# Patient Record
Sex: Female | Born: 1980 | Race: White | Hispanic: No | Marital: Married | Smoking: Never smoker
Health system: Southern US, Community
[De-identification: ages and names within clinical notes are randomized; demographics above are authoritative.]

## PROBLEM LIST (undated history)

## (undated) ENCOUNTER — Inpatient Hospital Stay (HOSPITAL_COMMUNITY): Payer: Self-pay

## (undated) DIAGNOSIS — G43909 Migraine, unspecified, not intractable, without status migrainosus: Secondary | ICD-10-CM

## (undated) DIAGNOSIS — J302 Other seasonal allergic rhinitis: Secondary | ICD-10-CM

## (undated) HISTORY — PX: WISDOM TOOTH EXTRACTION: SHX21

---

## 2008-03-22 ENCOUNTER — Ambulatory Visit: Payer: Self-pay | Admitting: Genetic Counselor

## 2009-06-25 ENCOUNTER — Encounter: Admission: RE | Admit: 2009-06-25 | Discharge: 2009-06-25 | Payer: Self-pay | Admitting: Obstetrics and Gynecology

## 2011-08-24 ENCOUNTER — Emergency Department (HOSPITAL_BASED_OUTPATIENT_CLINIC_OR_DEPARTMENT_OTHER)
Admission: EM | Admit: 2011-08-24 | Discharge: 2011-08-24 | Disposition: A | Discharge status: Home / Self Care | Payer: 59 | Attending: Emergency Medicine | Admitting: Emergency Medicine

## 2011-08-24 ENCOUNTER — Encounter (HOSPITAL_BASED_OUTPATIENT_CLINIC_OR_DEPARTMENT_OTHER): Payer: Self-pay | Admitting: *Deleted

## 2011-08-24 DIAGNOSIS — G43909 Migraine, unspecified, not intractable, without status migrainosus: Secondary | ICD-10-CM | POA: Insufficient documentation

## 2011-08-24 HISTORY — DX: Other seasonal allergic rhinitis: J30.2

## 2011-08-24 HISTORY — DX: Migraine, unspecified, not intractable, without status migrainosus: G43.909

## 2011-08-24 MED ORDER — KETOROLAC TROMETHAMINE 30 MG/ML IJ SOLN
30.0000 mg | Freq: Once | INTRAMUSCULAR | Status: AC
  Administered 2011-08-24: 30 mg via INTRAVENOUS
  Filled 2011-08-24: qty 1

## 2011-08-24 MED ORDER — DEXAMETHASONE SODIUM PHOSPHATE 10 MG/ML IJ SOLN
10.0000 mg | Freq: Once | INTRAMUSCULAR | Status: AC
  Administered 2011-08-24: 10 mg via INTRAVENOUS
  Filled 2011-08-24: qty 1

## 2011-08-24 MED ORDER — METOCLOPRAMIDE HCL 5 MG/ML IJ SOLN
10.0000 mg | Freq: Once | INTRAMUSCULAR | Status: AC
  Administered 2011-08-24: 10 mg via INTRAVENOUS
  Filled 2011-08-24: qty 2

## 2011-08-24 MED ORDER — DIPHENHYDRAMINE HCL 50 MG/ML IJ SOLN
12.5000 mg | Freq: Once | INTRAMUSCULAR | Status: AC
  Administered 2011-08-24: 12.5 mg via INTRAVENOUS
  Filled 2011-08-24: qty 1

## 2011-08-24 MED ORDER — SODIUM CHLORIDE 0.9 % IV BOLUS (SEPSIS)
1000.0000 mL | Freq: Once | INTRAVENOUS | Status: AC
  Administered 2011-08-24: 1000 mL via INTRAVENOUS

## 2011-08-24 NOTE — ED Provider Notes (Signed)
History  This chart was scribed for Kim Numbers, MD by Erskine Emery. This patient was seen in room MH10/MH10 and the patient's care was started at 18:18.  CSN: [id number]  Arrival date & time 08/24/11  1804   First MD Initiated Contact with Patient 08/24/11 1818      Chief Complaint  Patient presents with  . Migraine    (Consider location/radiation/quality/duration/timing/severity/associated sxs/prior treatment) HPI  Kim Collins is a 31 y.o. female who presents to the Emergency Department complaining of a constant, worsening severe headache located in the right forehead with no radiation since last weekend with associated general weakness, chills, photophobia, nausea, vomiting, and sensitivity to sound. Pt has only vomited once today, after coming to the ED. Pt denies any associated dizziness, neck pain, or tingling and numbness of extremities. Pt reports a history of migraines since she was little and describes her symptoms as similar to previous episodes, but claims she has only ever had about 2 or 3 episodes of similar severity. Pt reports that normally her head will get hot when an episode is coming on and that this did happen with this episode, but that she also noticed her head was less hot than normal in the car on the way to the ED. Pt reports that seasonal changes and cessation of her birth control medication typically bring on similar episodes and that she recently stopped taking her birth control because it was time for a placebo week. Pt has a neurologist (Dr. Vela Prose) that follows her for her migraines and has prescribed her a three-tier medication regimen, including ketoprofen, Imitrex, and Dilaudid. Pt reports she has been taking those medications with temporary mild relief, then repeated worsening. Pt also reports she took Dilaudid this morning with no relief. Pt denies fever but states that her head is warm. Pt also denies any urinary symptoms and states that she just had her period  so she believes she is not pregnant, nor is she trying to get pregnant. Pt reports no known allergies and claims she has never before come to the ED to treat a headache. Pt reports she has an appointment with Dr. Vela Prose tomorrow but the pain was so severe she couldn't wait, and typically she sees Dr. Vela Prose every 4-6 months for a routine check up.    Past Medical History  Diagnosis Date  . Migraines   . Seasonal allergies     History reviewed. No pertinent past surgical history.  History reviewed. No pertinent family history.  History  Substance Use Topics  . Smoking status: Never Smoker   . Smokeless tobacco: Not on file  . Alcohol Use: Yes    OB History    Grav Para Term Preterm Abortions TAB SAB Ect Mult Living                  Review of Systems  Constitutional: Positive for chills.  HENT: Negative for neck pain.   Eyes: Positive for photophobia.  Respiratory: Negative.   Cardiovascular: Negative.   Gastrointestinal: Positive for nausea and vomiting.  Genitourinary: Negative.   Musculoskeletal: Negative.   Skin: Negative.   Neurological: Positive for weakness and headaches. Negative for numbness.  Hematological: Negative.   Psychiatric/Behavioral: Negative.   All other systems reviewed and are negative.    A complete 10 system review of systems was obtained and all systems are negative except as noted in the HPI and PMH.   Allergies  Review of patient's allergies indicates not on file.  Home Medications  No current outpatient prescriptions on file.  Triage Vitals: BP 118/74  Pulse 88  Temp 97.4 F (36.3 C) (Oral)  Resp 18  Ht 5\' 2"  (1.575 m)  Wt 125 lb (56.7 kg)  BMI 22.86 kg/m2  SpO2 100%  LMP 08/21/2011  Physical Exam  Nursing note and vitals reviewed. GEN: Well-developed, well-nourished female in no distress HEENT: Atraumatic, normocephalic. Oropharynx clear without erythema EYES: PERRLA BL, no scleral icterus. NECK: Trachea midline, no  meningismus CV: regular rate and rhythm. No murmurs, rubs, or gallops PULM: No respiratory distress.  No crackles, wheezes, or rales. GI: soft, non-tender. No guarding, rebound, or tenderness. + bowel sounds  GU: deferred Neuro: cranial nerves grossly 2-12 intact, no abnormalities of strength or sensation, A and O x 3, no pronator drift, no dysmetria on finger to nose test bilaterally MSK: Patient moves all 4 extremities symmetrically, no deformity, edema, or injury noted Skin: No rashes petechiae, purpura, or jaundice Psych: no abnormality of mood   ED Course  Procedures (including critical care time)  DIAGNOSTIC STUDIES: Oxygen Saturation is 100% on room air, normal by my interpretation.    COORDINATION OF CARE:  18:30--I discussed treatment plan including medication with pt and pt agreed. 1830: Medication Orders: Diphenhydramine (Benadryl) injection 12.5 mg-once; Metoclopramide (Reglan) injection 10 mg-once; Sodium Chloride 0.9% bolus 1,000 mL-once 1915: Medication Orders: Ketorolac (Toradol) 30 mg/mL injection 30 mg-once.   Labs Reviewed - No data to display No results found.   1. Migraine       MDM  Patient was evaluated by myself. Based on presentation patient was having a more severe version of her normal migraine. She was given headache cocktail with Benadryl, Reglan, and IV fluids. Patient had improvement of her pain to a 4/10. A dose of Toradol was ordered. Patient confirms that she was no longer nauseous. She had no focal neurologic symptoms or head injury to suggest need for intracranial injury today. Patient was afebrile and hemodynamically stable. She'll be reassessed following her dose of Toradol.  8:13 PM Following this patient reports that her pain is fairly completely gone. Patient was given a dose of Decadron IV. She can followup with her neurologist tomorrow.  I personally performed the services described in this documentation, which was scribed in my  presence. The recorded information has been reviewed and considered.         Kim Numbers, MD 08/24/11 2017

## 2011-08-24 NOTE — ED Notes (Signed)
Pt ambulated without difficulty IV DCed

## 2011-08-24 NOTE — ED Notes (Signed)
Pt has hx of migraines and has had this one off and on x 1 week. Has tried all 3 "tiers" of her medications without relief.

## 2015-02-18 NOTE — L&D Delivery Note (Signed)
Delivery Note At 4:39 PM a viable female was delivered via Vaginal, Spontaneous Delivery (presentation:vertex).  APGAR: 8, 9; weight 6 lb 11.6 oz (3050 g).   Placenta status: routine  Cord:  with the following complications: loose nuchal cord, delivered through it.  Anesthesia:   Episiotomy: Median Lacerations: Vaginal;1st degree Suture Repair: 3.0 vicryl rapide Est. Blood Loss (mL): 450cc  Mom to postpartum.  Baby to Couplet care / Skin to Skin.  Small ~2cm benign labial nevus on L labia removed. Consent signed prior to planned removal.   Kim Collins 10/19/2015, 6:51 PM

## 2015-03-12 LAB — OB RESULTS CONSOLE ANTIBODY SCREEN: ANTIBODY SCREEN: NEGATIVE

## 2015-03-12 LAB — OB RESULTS CONSOLE ABO/RH: RH TYPE: POSITIVE

## 2015-03-12 LAB — OB RESULTS CONSOLE RPR: RPR: NONREACTIVE

## 2015-03-12 LAB — OB RESULTS CONSOLE HGB/HCT, BLOOD
HCT: 42 %
HEMOGLOBIN: 13.8 g/dL

## 2015-03-12 LAB — OB RESULTS CONSOLE RUBELLA ANTIBODY, IGM: Rubella: IMMUNE

## 2015-03-12 LAB — OB RESULTS CONSOLE HEPATITIS B SURFACE ANTIGEN: HEP B S AG: NEGATIVE

## 2015-03-12 LAB — OB RESULTS CONSOLE HIV ANTIBODY (ROUTINE TESTING): HIV: NONREACTIVE

## 2015-03-23 LAB — OB RESULTS CONSOLE GC/CHLAMYDIA
CHLAMYDIA, DNA PROBE: NEGATIVE
Gonorrhea: NEGATIVE

## 2015-09-12 LAB — OB RESULTS CONSOLE GBS: GBS: NEGATIVE

## 2015-10-08 ENCOUNTER — Inpatient Hospital Stay (HOSPITAL_COMMUNITY)
Admission: AD | Admit: 2015-10-08 | Discharge: 2015-10-08 | Disposition: A | Discharge status: Home / Self Care | Payer: 59 | Source: Ambulatory Visit | Attending: Obstetrics and Gynecology | Admitting: Obstetrics and Gynecology

## 2015-10-08 ENCOUNTER — Encounter (HOSPITAL_COMMUNITY): Payer: Self-pay | Admitting: *Deleted

## 2015-10-08 DIAGNOSIS — Z3A40 40 weeks gestation of pregnancy: Secondary | ICD-10-CM | POA: Diagnosis not present

## 2015-10-08 DIAGNOSIS — O471 False labor at or after 37 completed weeks of gestation: Secondary | ICD-10-CM | POA: Diagnosis not present

## 2015-10-08 NOTE — MAU Note (Signed)
Contractions for awhile this AM. No leaking or bleeding.

## 2015-10-08 NOTE — Discharge Instructions (Signed)
Keep your scheduled appt for prenatal care. Call the office or nurse on call with further concerns or return to MAU as needed.

## 2015-10-12 ENCOUNTER — Encounter (HOSPITAL_COMMUNITY): Payer: Self-pay

## 2015-10-12 ENCOUNTER — Telehealth (HOSPITAL_COMMUNITY): Payer: Self-pay | Admitting: *Deleted

## 2015-10-12 ENCOUNTER — Inpatient Hospital Stay (HOSPITAL_COMMUNITY)
Admission: AD | Admit: 2015-10-12 | Discharge: 2015-10-12 | Disposition: A | Discharge status: Home / Self Care | Payer: 59 | Source: Ambulatory Visit | Attending: Obstetrics and Gynecology | Admitting: Obstetrics and Gynecology

## 2015-10-12 DIAGNOSIS — Z3A39 39 weeks gestation of pregnancy: Secondary | ICD-10-CM | POA: Insufficient documentation

## 2015-10-12 DIAGNOSIS — Z3493 Encounter for supervision of normal pregnancy, unspecified, third trimester: Secondary | ICD-10-CM | POA: Insufficient documentation

## 2015-10-12 LAB — POCT FERN TEST: POCT Fern Test: NEGATIVE

## 2015-10-12 NOTE — MAU Note (Signed)
?   Leaking small amounts since yesterday- once in morning and one in evening.  Then ? Leaking small amt this afternoon.  Clear and watery each time. Contractions have been 4-7 min in the past but since 2 hours ago, they seem to be lasting longer.  No bleeding. Baby is moving less today.

## 2015-10-12 NOTE — MAU Provider Note (Signed)
Ms. Kim Collins is a 35 y.o. G1P0 at [redacted]w[redacted]d  who presents to MAU today complaining of LOF since yesterday. She states intermittent LOF noted a few times since yesterday. She denies vaginal bleeding. She states regular contractions prior to arrival, but less now. She reports some decreased fetal movement for the last few days, but has noted movement since arrival in MAU. She denies complications with the pregnancy.   BP 140/83   Pulse 78   Temp 98.4 F (36.9 C) (Oral)   Resp 16  GENERAL: Well-developed, well-nourished female in no acute distress.  HEAD: Normocephalic, atraumatic.  CHEST: Normal effort of breathing, regular heart rate ABDOMEN: Soft, nontender, nondistended.  PELVIC: Normal external female genitalia. Vagina is pink and rugated.   Moderate amount of thin, white discharge.  Negative pooling. Gravid uterus.   EXTREMITIES: No cyanosis, clubbing, or edema  Fern - negative  Fetal Monitoring:  Baseline: 130 bpm, moderate variability, + accelerations, no decelerations Contractions: moderate UI with occasional contractions  A: SIUP at [redacted]w[redacted]d Membranes intact  P: Report given to RN to contact MD on call for further instructions  Luvenia Redden, PA-C 10/12/2015 8:11 PM

## 2015-10-12 NOTE — Telephone Encounter (Signed)
Preadmission screen  

## 2015-10-12 NOTE — Discharge Instructions (Signed)
Third Trimester of Pregnancy °The third trimester is from week 29 through week 42, months 7 through 9. The third trimester is a time when the fetus is growing rapidly. At the end of the ninth month, the fetus is about 20 inches in length and weighs 6-10 pounds.  °BODY CHANGES °Your body goes through many changes during pregnancy. The changes vary from woman to woman.  °· Your weight will continue to increase. You can expect to gain 25-35 pounds (11-16 kg) by the end of the pregnancy. °· You may begin to get stretch marks on your hips, abdomen, and breasts. °· You may urinate more often because the fetus is moving lower into your pelvis and pressing on your bladder. °· You may develop or continue to have heartburn as a result of your pregnancy. °· You may develop constipation because certain hormones are causing the muscles that push waste through your intestines to slow down. °· You may develop hemorrhoids or swollen, bulging veins (varicose veins). °· You may have pelvic pain because of the weight gain and pregnancy hormones relaxing your joints between the bones in your pelvis. Backaches may result from overexertion of the muscles supporting your posture. °· You may have changes in your hair. These can include thickening of your hair, rapid growth, and changes in texture. Some women also have hair loss during or after pregnancy, or hair that feels dry or thin. Your hair will most likely return to normal after your baby is born. °· Your breasts will continue to grow and be tender. A yellow discharge may leak from your breasts called colostrum. °· Your belly button may stick out. °· You may feel short of breath because of your expanding uterus. °· You may notice the fetus "dropping," or moving lower in your abdomen. °· You may have a bloody mucus discharge. This usually occurs a few days to a week before labor begins. °· Your cervix becomes thin and soft (effaced) near your due date. °WHAT TO EXPECT AT YOUR PRENATAL  EXAMS  °You will have prenatal exams every 2 weeks until week 36. Then, you will have weekly prenatal exams. During a routine prenatal visit: °· You will be weighed to make sure you and the fetus are growing normally. °· Your blood pressure is taken. °· Your abdomen will be measured to track your baby's growth. °· The fetal heartbeat will be listened to. °· Any test results from the previous visit will be discussed. °· You may have a cervical check near your due date to see if you have effaced. °At around 36 weeks, your caregiver will check your cervix. At the same time, your caregiver will also perform a test on the secretions of the vaginal tissue. This test is to determine if a type of bacteria, Group B streptococcus, is present. Your caregiver will explain this further. °Your caregiver may ask you: °· What your birth plan is. °· How you are feeling. °· If you are feeling the baby move. °· If you have had any abnormal symptoms, such as leaking fluid, bleeding, severe headaches, or abdominal cramping. °· If you are using any tobacco products, including cigarettes, chewing tobacco, and electronic cigarettes. °· If you have any questions. °Other tests or screenings that may be performed during your third trimester include: °· Blood tests that check for low iron levels (anemia). °· Fetal testing to check the health, activity level, and growth of the fetus. Testing is done if you have certain medical conditions or if   there are problems during the pregnancy. °· HIV (human immunodeficiency virus) testing. If you are at high risk, you may be screened for HIV during your third trimester of pregnancy. °FALSE LABOR °You may feel small, irregular contractions that eventually go away. These are called Braxton Hicks contractions, or false labor. Contractions may last for hours, days, or even weeks before true labor sets in. If contractions come at regular intervals, intensify, or become painful, it is best to be seen by your  caregiver.  °SIGNS OF LABOR  °· Menstrual-like cramps. °· Contractions that are 5 minutes apart or less. °· Contractions that start on the top of the uterus and spread down to the lower abdomen and back. °· A sense of increased pelvic pressure or back pain. °· A watery or bloody mucus discharge that comes from the vagina. °If you have any of these signs before the 37th week of pregnancy, call your caregiver right away. You need to go to the hospital to get checked immediately. °HOME CARE INSTRUCTIONS  °· Avoid all smoking, herbs, alcohol, and unprescribed drugs. These chemicals affect the formation and growth of the baby. °· Do not use any tobacco products, including cigarettes, chewing tobacco, and electronic cigarettes. If you need help quitting, ask your health care provider. You may receive counseling support and other resources to help you quit. °· Follow your caregiver's instructions regarding medicine use. There are medicines that are either safe or unsafe to take during pregnancy. °· Exercise only as directed by your caregiver. Experiencing uterine cramps is a good sign to stop exercising. °· Continue to eat regular, healthy meals. °· Wear a good support bra for breast tenderness. °· Do not use hot tubs, steam rooms, or saunas. °· Wear your seat belt at all times when driving. °· Avoid raw meat, uncooked cheese, cat litter boxes, and soil used by cats. These carry germs that can cause birth defects in the baby. °· Take your prenatal vitamins. °· Take 1500-2000 mg of calcium daily starting at the 20th week of pregnancy until you deliver your baby. °· Try taking a stool softener (if your caregiver approves) if you develop constipation. Eat more high-fiber foods, such as fresh vegetables or fruit and whole grains. Drink plenty of fluids to keep your urine clear or pale yellow. °· Take warm sitz baths to soothe any pain or discomfort caused by hemorrhoids. Use hemorrhoid cream if your caregiver approves. °· If  you develop varicose veins, wear support hose. Elevate your feet for 15 minutes, 3-4 times a day. Limit salt in your diet. °· Avoid heavy lifting, wear low heal shoes, and practice good posture. °· Rest a lot with your legs elevated if you have leg cramps or low back pain. °· Visit your dentist if you have not gone during your pregnancy. Use a soft toothbrush to brush your teeth and be gentle when you floss. °· A sexual relationship may be continued unless your caregiver directs you otherwise. °· Do not travel far distances unless it is absolutely necessary and only with the approval of your caregiver. °· Take prenatal classes to understand, practice, and ask questions about the labor and delivery. °· Make a trial run to the hospital. °· Pack your hospital bag. °· Prepare the baby's nursery. °· Continue to go to all your prenatal visits as directed by your caregiver. °SEEK MEDICAL CARE IF: °· You are unsure if you are in labor or if your water has broken. °· You have dizziness. °· You have   mild pelvic cramps, pelvic pressure, or nagging pain in your abdominal area.  You have persistent nausea, vomiting, or diarrhea.  You have a bad smelling vaginal discharge.  You have pain with urination. SEEK IMMEDIATE MEDICAL CARE IF:   You have a fever.  You are leaking fluid from your vagina.  You have spotting or bleeding from your vagina.  You have severe abdominal cramping or pain.  You have rapid weight loss or gain.  You have shortness of breath with chest pain.  You notice sudden or extreme swelling of your face, hands, ankles, feet, or legs.  You have not felt your baby move in over an hour.  You have severe headaches that do not go away with medicine.  You have vision changes.   This information is not intended to replace advice given to you by your health care provider. Make sure you discuss any questions you have with your health care provider.   Document Released: 01/28/2001 Document  Revised: 02/24/2014 Document Reviewed: 04/06/2012 Elsevier Interactive Patient Education 2016 [employer] of the uterus can occur throughout pregnancy. Contractions are not always a sign that you are in labor.  WHAT ARE BRAXTON HICKS CONTRACTIONS?  Contractions that occur before labor are called Braxton Hicks contractions, or false labor. Toward the end of pregnancy (32-34 weeks), these contractions can develop more often and may become more forceful. This is not true labor because these contractions do not result in opening (dilatation) and thinning of the cervix. They are sometimes difficult to tell apart from true labor because these contractions can be forceful and people have different pain tolerances. You should not feel embarrassed if you go to the hospital with false labor. Sometimes, the only way to tell if you are in true labor is for your health care provider to look for changes in the cervix. If there are no prenatal problems or other health problems associated with the pregnancy, it is completely safe to be sent home with false labor and await the onset of true labor. HOW CAN YOU TELL THE DIFFERENCE BETWEEN TRUE AND FALSE LABOR? False Labor  The contractions of false labor are usually shorter and not as hard as those of true labor.   The contractions are usually irregular.   The contractions are often felt in the front of the lower abdomen and in the groin.   The contractions may go away when you walk around or change positions while lying down.   The contractions get weaker and are shorter lasting as time goes on.   The contractions do not usually become progressively stronger, regular, and closer together as with true labor.  True Labor  Contractions in true labor last 30-70 seconds, become very regular, usually become more intense, and increase in frequency.   The contractions do not go away with walking.   The discomfort  is usually felt in the top of the uterus and spreads to the lower abdomen and low back.   True labor can be determined by your health care provider with an exam. This will show that the cervix is dilating and getting thinner.  WHAT TO REMEMBER  Keep up with your usual exercises and follow other instructions given by your health care provider.   Take medicines as directed by your health care provider.   Keep your regular prenatal appointments.   Eat and drink lightly if you think you are going into labor.   If Braxton Hicks contractions are making  you uncomfortable:   Change your position from lying down or resting to walking, or from walking to resting.   Sit and rest in a tub of warm water.   Drink 2-3 glasses of water. Dehydration may cause these contractions.   Do slow and deep breathing several times an hour.  WHEN SHOULD I SEEK IMMEDIATE MEDICAL CARE? Seek immediate medical care if:  Your contractions become stronger, more regular, and closer together.   You have fluid leaking or gushing from your vagina.   You have a fever.   You pass blood-tinged mucus.   You have vaginal bleeding.   You have continuous abdominal pain.   You have low back pain that you never had before.   You feel your baby's head pushing down and causing pelvic pressure.   Your baby is not moving as much as it used to.    This information is not intended to replace advice given to you by your health care provider. Make sure you discuss any questions you have with your health care provider.   Document Released: 02/03/2005 Document Revised: 02/08/2013 Document Reviewed: 11/15/2012 Elsevier Interactive Patient Education 2016 [place]. Fetal Movement Counts Patient Name: __________________________________________________ Patient Due Date: ____________________ Performing a fetal movement count is highly recommended in high-risk pregnancies, but it is good for every pregnant  woman to do. Your health care provider may ask you to start counting fetal movements at 28 weeks of the pregnancy. Fetal movements often increase:  After eating a full meal.  After physical activity.  After eating or drinking something sweet or cold.  At rest. Pay attention to when you feel the baby is most active. This will help you notice a pattern of your baby's sleep and wake cycles and what factors contribute to an increase in fetal movement. It is important to perform a fetal movement count at the same time each day when your baby is normally most active.  HOW TO COUNT FETAL MOVEMENTS 1. Find a quiet and comfortable area to sit or lie down on your left side. Lying on your left side provides the best blood and oxygen circulation to your baby. 2. Write down the day and time on a sheet of paper or in a journal. 3. Start counting kicks, flutters, swishes, rolls, or jabs in a 2-hour period. You should feel at least 10 movements within 2 hours. 4. If you do not feel 10 movements in 2 hours, wait 2-3 hours and count again. Look for a change in the pattern or not enough counts in 2 hours. SEEK MEDICAL CARE IF:  You feel less than 10 counts in 2 hours, tried twice.  There is no movement in over an hour.  The pattern is changing or taking longer each day to reach 10 counts in 2 hours.  You feel the baby is not moving as he or she usually does. Date: ____________ Movements: ____________ Start time: ____________ Kim Collins time: ____________  Date: ____________ Movements: ____________ Start time: ____________ Kim Collins time: ____________ Date: ____________ Movements: ____________ Start time: ____________ Kim Collins time: ____________ Date: ____________ Movements: ____________ Start time: ____________ Kim Collins time: ____________ Date: ____________ Movements: ____________ Start time: ____________ Kim Collins time: ____________ Date: ____________ Movements: ____________ Start time: ____________ Kim Collins time:  ____________ Date: ____________ Movements: ____________ Start time: ____________ Kim Collins time: ____________ Date: ____________ Movements: ____________ Start time: ____________ Kim Collins time: ____________  Date: ____________ Movements: ____________ Start time: ____________ Kim Collins time: ____________ Date: ____________ Movements: ____________ Start time: ____________ Kim Collins time: ____________ Date:  ____________ Movements: ____________ Start time: ____________ Kim Collins time: ____________ Date: ____________ Movements: ____________ Start time: ____________ Kim Collins time: ____________ Date: ____________ Movements: ____________ Start time: ____________ Kim Collins time: ____________ Date: ____________ Movements: ____________ Start time: ____________ Kim Collins time: ____________ Date: ____________ Movements: ____________ Start time: ____________ Kim Collins time: ____________  Date: ____________ Movements: ____________ Start time: ____________ Kim Collins time: ____________ Date: ____________ Movements: ____________ Start time: ____________ Kim Collins time: ____________ Date: ____________ Movements: ____________ Start time: ____________ Kim Collins time: ____________ Date: ____________ Movements: ____________ Start time: ____________ Kim Collins time: ____________ Date: ____________ Movements: ____________ Start time: ____________ Kim Collins time: ____________ Date: ____________ Movements: ____________ Start time: ____________ Kim Collins time: ____________ Date: ____________ Movements: ____________ Start time: ____________ Kim Collins time: ____________  Date: ____________ Movements: ____________ Start time: ____________ Kim Collins time: ____________ Date: ____________ Movements: ____________ Start time: ____________ Kim Collins time: ____________ Date: ____________ Movements: ____________ Start time: ____________ Kim Collins time: ____________ Date: ____________ Movements: ____________ Start time: ____________ Kim Collins time: ____________ Date: ____________ Movements:  ____________ Start time: ____________ Kim Collins time: ____________ Date: ____________ Movements: ____________ Start time: ____________ Kim Collins time: ____________ Date: ____________ Movements: ____________ Start time: ____________ Kim Collins time: ____________  Date: ____________ Movements: ____________ Start time: ____________ Kim Collins time: ____________ Date: ____________ Movements: ____________ Start time: ____________ Kim Collins time: ____________ Date: ____________ Movements: ____________ Start time: ____________ Kim Collins time: ____________ Date: ____________ Movements: ____________ Start time: ____________ Kim Collins time: ____________ Date: ____________ Movements: ____________ Start time: ____________ Kim Collins time: ____________ Date: ____________ Movements: ____________ Start time: ____________ Kim Collins time: ____________ Date: ____________ Movements: ____________ Start time: ____________ Kim Collins time: ____________  Date: ____________ Movements: ____________ Start time: ____________ Kim Collins time: ____________ Date: ____________ Movements: ____________ Start time: ____________ Kim Collins time: ____________ Date: ____________ Movements: ____________ Start time: ____________ Kim Collins time: ____________ Date: ____________ Movements: ____________ Start time: ____________ Kim Collins time: ____________ Date: ____________ Movements: ____________ Start time: ____________ Kim Collins time: ____________ Date: ____________ Movements: ____________ Start time: ____________ Kim Collins time: ____________ Date: ____________ Movements: ____________ Start time: ____________ Kim Collins time: ____________  Date: ____________ Movements: ____________ Start time: ____________ Kim Collins time: ____________ Date: ____________ Movements: ____________ Start time: ____________ Kim Collins time: ____________ Date: ____________ Movements: ____________ Start time: ____________ Kim Collins time: ____________ Date: ____________ Movements: ____________ Start time: ____________ Kim Collins  time: ____________ Date: ____________ Movements: ____________ Start time: ____________ Kim Collins time: ____________ Date: ____________ Movements: ____________ Start time: ____________ Kim Collins time: ____________ Date: ____________ Movements: ____________ Start time: ____________ Kim Collins time: ____________  Date: ____________ Movements: ____________ Start time: ____________ Kim Collins time: ____________ Date: ____________ Movements: ____________ Start time: ____________ Kim Collins time: ____________ Date: ____________ Movements: ____________ Start time: ____________ Kim Collins time: ____________ Date: ____________ Movements: ____________ Start time: ____________ Kim Collins time: ____________ Date: ____________ Movements: ____________ Start time: ____________ Kim Collins time: ____________ Date: ____________ Movements: ____________ Start time: ____________ Kim Collins time: ____________   This information is not intended to replace advice given to you by your health care provider. Make sure you discuss any questions you have with your health care provider.   Document Released: 03/05/2006 Document Revised: 02/24/2014 Document Reviewed: 12/01/2011 Elsevier Interactive Patient Education [employer].

## 2015-10-18 ENCOUNTER — Encounter (HOSPITAL_COMMUNITY): Payer: Self-pay | Admitting: *Deleted

## 2015-10-18 ENCOUNTER — Telehealth (HOSPITAL_COMMUNITY): Payer: Self-pay | Admitting: *Deleted

## 2015-10-18 NOTE — H&P (Signed)
Kim Collins is a 35 y.o. female presenting for IOL.  OB History    Gravida Para Term Preterm AB Living   1             SAB TAB Ectopic Multiple Live Births                 Past Medical History:  Diagnosis Date  . Migraines   . Seasonal allergies    Past Surgical History:  Procedure Laterality Date  . WISDOM TOOTH EXTRACTION     Family History: family history includes Cancer in her father, paternal aunt, and paternal grandmother; Diabetes in her father; Hypertension in her father. Social History:  reports that she has never smoked. She has never used smokeless tobacco. She reports that she drinks alcohol. She reports that she does not use drugs.     Maternal Diabetes: No Genetic Screening: Normal Maternal Ultrasounds/Referrals: Normal Fetal Ultrasounds or other Referrals:  None Maternal Substance Abuse:  No Significant Maternal Medications:  None Significant Maternal Lab Results:  None Other Comments:  None  ROS WNL  History   There were no vitals taken for this visit. Exam Physical Exam (from clinic) NAD, A&O NWOB Abd soft, nondistended, gravid  Prenatal labs: ABO, Rh: O/Positive/-- (01/23 0000) Antibody: Negative (01/23 0000) Rubella: Immune (01/23 0000) RPR: Nonreactive (01/23 0000)  HBsAg: Negative (01/23 0000)  HIV: Non-reactive (01/23 0000)  GBS: Negative (07/26 0000)   Assessment/Plan: A/P: Pt is a G1P0 here for IOL.  IOL: SVE in office 1cm/50% effaced. Plan on pitocin/AROM  MWB: no sig med/surg issues besides h/o migraines  FWB: vertex in office  GBS neg  Kim Garfinkel MD   Colin Benton Jobeth Pangilinan 10/18/2015, 4:30 PM

## 2015-10-18 NOTE — Telephone Encounter (Signed)
Preadmission screen  

## 2015-10-19 ENCOUNTER — Inpatient Hospital Stay (HOSPITAL_COMMUNITY): Payer: 59 | Admitting: Anesthesiology

## 2015-10-19 ENCOUNTER — Inpatient Hospital Stay (HOSPITAL_COMMUNITY)
Admission: RE | Admit: 2015-10-19 | Discharge: 2015-10-21 | DRG: 775 | Disposition: A | Discharge status: Home / Self Care | Payer: 59 | Source: Ambulatory Visit | Attending: Obstetrics and Gynecology | Admitting: Obstetrics and Gynecology

## 2015-10-19 ENCOUNTER — Encounter (HOSPITAL_COMMUNITY): Payer: Self-pay

## 2015-10-19 DIAGNOSIS — Z3403 Encounter for supervision of normal first pregnancy, third trimester: Secondary | ICD-10-CM | POA: Diagnosis present

## 2015-10-19 DIAGNOSIS — Z833 Family history of diabetes mellitus: Secondary | ICD-10-CM

## 2015-10-19 DIAGNOSIS — Z3A40 40 weeks gestation of pregnancy: Secondary | ICD-10-CM | POA: Diagnosis not present

## 2015-10-19 DIAGNOSIS — D280 Benign neoplasm of vulva: Secondary | ICD-10-CM | POA: Diagnosis present

## 2015-10-19 DIAGNOSIS — Z3490 Encounter for supervision of normal pregnancy, unspecified, unspecified trimester: Secondary | ICD-10-CM

## 2015-10-19 DIAGNOSIS — Z8249 Family history of ischemic heart disease and other diseases of the circulatory system: Secondary | ICD-10-CM | POA: Diagnosis not present

## 2015-10-19 LAB — ABO/RH: ABO/RH(D): O POS

## 2015-10-19 LAB — CBC
HEMATOCRIT: 35.4 % — AB (ref 36.0–46.0)
Hemoglobin: 12.3 g/dL (ref 12.0–15.0)
MCH: 31.1 pg (ref 26.0–34.0)
MCHC: 34.7 g/dL (ref 30.0–36.0)
MCV: 89.4 fL (ref 78.0–100.0)
PLATELETS: 161 10*3/uL (ref 150–400)
RBC: 3.96 MIL/uL (ref 3.87–5.11)
RDW: 13.6 % (ref 11.5–15.5)
WBC: 10.9 10*3/uL — AB (ref 4.0–10.5)

## 2015-10-19 LAB — TYPE AND SCREEN
ABO/RH(D): O POS
Antibody Screen: NEGATIVE

## 2015-10-19 LAB — RPR: RPR Ser Ql: NONREACTIVE

## 2015-10-19 MED ORDER — OXYCODONE-ACETAMINOPHEN 5-325 MG PO TABS
1.0000 | ORAL_TABLET | ORAL | Status: DC | PRN
Start: 2015-10-19 — End: 2015-10-19

## 2015-10-19 MED ORDER — MISOPROSTOL 25 MCG QUARTER TABLET
25.0000 ug | ORAL_TABLET | ORAL | Status: DC | PRN
  Administered 2015-10-19: 25 ug via VAGINAL
  Filled 2015-10-19: qty 1
  Filled 2015-10-19 (×2): qty 0.25

## 2015-10-19 MED ORDER — OXYTOCIN BOLUS FROM INFUSION
500.0000 mL | Freq: Once | INTRAVENOUS | Status: AC
Start: 2015-10-19 — End: 2015-10-19
  Administered 2015-10-19: 500 mL via INTRAVENOUS

## 2015-10-19 MED ORDER — LIDOCAINE HCL (PF) 1 % IJ SOLN
INTRAMUSCULAR | Status: DC | PRN
  Administered 2015-10-19 (×2): 6 mL

## 2015-10-19 MED ORDER — LACTATED RINGERS IV SOLN: 500.0000 mL | Freq: Once | INTRAVENOUS | Status: DC

## 2015-10-19 MED ORDER — ONDANSETRON HCL 4 MG/2ML IJ SOLN: 4.0000 mg | INTRAMUSCULAR | Status: DC | PRN

## 2015-10-19 MED ORDER — OXYCODONE-ACETAMINOPHEN 5-325 MG PO TABS: 2.0000 | ORAL_TABLET | ORAL | Status: DC | PRN

## 2015-10-19 MED ORDER — PRENATAL MULTIVITAMIN CH
1.0000 | ORAL_TABLET | Freq: Every day | ORAL | Status: DC
  Administered 2015-10-20 – 2015-10-21 (×2): 1 via ORAL
  Filled 2015-10-19 (×2): qty 1

## 2015-10-19 MED ORDER — DIBUCAINE 1 % RE OINT: 1.0000 "application " | TOPICAL_OINTMENT | RECTAL | Status: DC | PRN

## 2015-10-19 MED ORDER — ONDANSETRON HCL 4 MG/2ML IJ SOLN: 4.0000 mg | Freq: Four times a day (QID) | INTRAMUSCULAR | Status: DC | PRN

## 2015-10-19 MED ORDER — SENNOSIDES-DOCUSATE SODIUM 8.6-50 MG PO TABS
2.0000 | ORAL_TABLET | ORAL | Status: DC
  Administered 2015-10-20 (×2): 2 via ORAL
  Filled 2015-10-19 (×2): qty 2

## 2015-10-19 MED ORDER — OXYTOCIN 40 UNITS IN LACTATED RINGERS INFUSION - SIMPLE MED
1.0000 m[IU]/min | INTRAVENOUS | Status: DC
  Administered 2015-10-19: 6 m[IU]/min via INTRAVENOUS
  Administered 2015-10-19: 2 m[IU]/min via INTRAVENOUS
  Filled 2015-10-19: qty 1000

## 2015-10-19 MED ORDER — FENTANYL 2.5 MCG/ML BUPIVACAINE 1/10 % EPIDURAL INFUSION (WH - ANES)
14.0000 mL/h | INTRAMUSCULAR | Status: DC | PRN
  Administered 2015-10-19: 14 mL/h via EPIDURAL
  Filled 2015-10-19 (×2): qty 125

## 2015-10-19 MED ORDER — COCONUT OIL OIL
1.0000 "application " | TOPICAL_OIL | Status: DC | PRN
  Filled 2015-10-19: qty 120

## 2015-10-19 MED ORDER — OXYCODONE-ACETAMINOPHEN 5-325 MG PO TABS: 1.0000 | ORAL_TABLET | ORAL | Status: DC | PRN

## 2015-10-19 MED ORDER — LACTATED RINGERS IV SOLN
INTRAVENOUS | Status: DC
  Administered 2015-10-19 (×2): via INTRAVENOUS

## 2015-10-19 MED ORDER — EPHEDRINE 5 MG/ML INJ
10.0000 mg | INTRAVENOUS | Status: DC | PRN
  Filled 2015-10-19: qty 4

## 2015-10-19 MED ORDER — LACTATED RINGERS IV SOLN
500.0000 mL | Freq: Once | INTRAVENOUS | Status: AC
  Administered 2015-10-19: 500 mL via INTRAVENOUS

## 2015-10-19 MED ORDER — BENZOCAINE-MENTHOL 20-0.5 % EX AERO
1.0000 "application " | INHALATION_SPRAY | CUTANEOUS | Status: DC | PRN
  Administered 2015-10-19 – 2015-10-20 (×2): 1 via TOPICAL
  Filled 2015-10-19: qty 56

## 2015-10-19 MED ORDER — PHENYLEPHRINE 40 MCG/ML (10ML) SYRINGE FOR IV PUSH (FOR BLOOD PRESSURE SUPPORT)
80.0000 ug | PREFILLED_SYRINGE | INTRAVENOUS | Status: DC | PRN
  Filled 2015-10-19: qty 5

## 2015-10-19 MED ORDER — IBUPROFEN 600 MG PO TABS
600.0000 mg | ORAL_TABLET | Freq: Four times a day (QID) | ORAL | Status: DC
  Administered 2015-10-19 – 2015-10-21 (×7): 600 mg via ORAL
  Filled 2015-10-19 (×7): qty 1

## 2015-10-19 MED ORDER — ACETAMINOPHEN 325 MG PO TABS
650.0000 mg | ORAL_TABLET | ORAL | Status: DC | PRN
  Administered 2015-10-20 (×2): 650 mg via ORAL
  Filled 2015-10-19 (×3): qty 2

## 2015-10-19 MED ORDER — WITCH HAZEL-GLYCERIN EX PADS: 1.0000 "application " | MEDICATED_PAD | CUTANEOUS | Status: DC | PRN

## 2015-10-19 MED ORDER — DIPHENHYDRAMINE HCL 25 MG PO CAPS: 25.0000 mg | ORAL_CAPSULE | Freq: Four times a day (QID) | ORAL | Status: DC | PRN

## 2015-10-19 MED ORDER — DIPHENHYDRAMINE HCL 50 MG/ML IJ SOLN: 12.5000 mg | INTRAMUSCULAR | Status: DC | PRN

## 2015-10-19 MED ORDER — SOD CITRATE-CITRIC ACID 500-334 MG/5ML PO SOLN: 30.0000 mL | ORAL | Status: DC | PRN

## 2015-10-19 MED ORDER — PHENYLEPHRINE 40 MCG/ML (10ML) SYRINGE FOR IV PUSH (FOR BLOOD PRESSURE SUPPORT)
80.0000 ug | PREFILLED_SYRINGE | INTRAVENOUS | Status: DC | PRN
  Filled 2015-10-19: qty 10
  Filled 2015-10-19: qty 5

## 2015-10-19 MED ORDER — LIDOCAINE HCL (PF) 1 % IJ SOLN
30.0000 mL | INTRAMUSCULAR | Status: DC | PRN
  Administered 2015-10-19: 30 mL via SUBCUTANEOUS
  Filled 2015-10-19: qty 30

## 2015-10-19 MED ORDER — ZOLPIDEM TARTRATE 5 MG PO TABS: 5.0000 mg | ORAL_TABLET | Freq: Every evening | ORAL | Status: DC | PRN

## 2015-10-19 MED ORDER — TERBUTALINE SULFATE 1 MG/ML IJ SOLN
0.2500 mg | Freq: Once | INTRAMUSCULAR | Status: DC | PRN
  Filled 2015-10-19: qty 1

## 2015-10-19 MED ORDER — LACTATED RINGERS IV SOLN: 500.0000 mL | INTRAVENOUS | Status: DC | PRN

## 2015-10-19 MED ORDER — BUTORPHANOL TARTRATE 1 MG/ML IJ SOLN: 1.0000 mg | INTRAMUSCULAR | Status: DC | PRN

## 2015-10-19 MED ORDER — SIMETHICONE 80 MG PO CHEW: 80.0000 mg | CHEWABLE_TABLET | ORAL | Status: DC | PRN

## 2015-10-19 MED ORDER — ONDANSETRON HCL 4 MG PO TABS: 4.0000 mg | ORAL_TABLET | ORAL | Status: DC | PRN

## 2015-10-19 MED ORDER — OXYTOCIN 40 UNITS IN LACTATED RINGERS INFUSION - SIMPLE MED
2.5000 [IU]/h | INTRAVENOUS | Status: DC
  Administered 2015-10-19: 2.5 [IU]/h via INTRAVENOUS

## 2015-10-19 MED ORDER — FLEET ENEMA 7-19 GM/118ML RE ENEM: 1.0000 | ENEMA | RECTAL | Status: DC | PRN

## 2015-10-19 MED ORDER — ACETAMINOPHEN 325 MG PO TABS: 650.0000 mg | ORAL_TABLET | ORAL | Status: DC | PRN

## 2015-10-19 MED ORDER — OXYCODONE-ACETAMINOPHEN 5-325 MG PO TABS
2.0000 | ORAL_TABLET | ORAL | Status: DC | PRN
Start: 2015-10-19 — End: 2015-10-19

## 2015-10-19 NOTE — Anesthesia Preprocedure Evaluation (Signed)
Anesthesia Evaluation  Patient identified by MRN, date of birth, ID band Patient awake    Reviewed: Allergy & Precautions, NPO status , Patient's Chart, lab work & pertinent test results  Airway Mallampati: II  TM Distance: >3 FB Neck ROM: Full    Dental no notable dental hx.    Pulmonary neg pulmonary ROS,    Pulmonary exam normal breath sounds clear to auscultation       Cardiovascular negative cardio ROS Normal cardiovascular exam Rhythm:Regular Rate:Normal     Neuro/Psych  Headaches, negative psych ROS   GI/Hepatic negative GI ROS, Neg liver ROS,   Endo/Other  negative endocrine ROS  Renal/GU negative Renal ROS  negative genitourinary   Musculoskeletal negative musculoskeletal ROS (+)   Abdominal   Peds negative pediatric ROS (+)  Hematology negative hematology ROS (+)   Anesthesia Other Findings   Reproductive/Obstetrics negative OB ROS                             Anesthesia Physical Anesthesia Plan  ASA: II  Anesthesia Plan: Epidural   Post-op Pain Management:    Induction: Intravenous  Airway Management Planned: Natural Airway  Additional Equipment:   Intra-op Plan:   Post-operative Plan:   Informed Consent: I have reviewed the patients History and Physical, chart, labs and discussed the procedure including the risks, benefits and alternatives for the proposed anesthesia with the patient or authorized representative who has indicated his/her understanding and acceptance.   Dental advisory given  Plan Discussed with: CRNA  Anesthesia Plan Comments: (Informed consent obtained prior to proceeding including risk of failure, 1% risk of PDPH, risk of minor discomfort and bruising.  Discussed rare but serious complications including epidural abscess, permanent nerve injury, epidural hematoma.  Discussed alternatives to epidural analgesia and patient desires to proceed.   Timeout performed pre-procedure verifying patient name, procedure, and platelet count.  Patient tolerated procedure well.)        Anesthesia Quick Evaluation

## 2015-10-19 NOTE — Progress Notes (Signed)
Patient ID: Kim Collins, female   DOB: [birth year], 35 y.o.   MRN: [id number] S: Desires cle now.   O:  Vitals  Vitals:   10/19/15 0846 10/19/15 0851 10/19/15 0856 10/19/15 0901  BP: 129/89 133/88 134/86 126/81  Pulse: 60 64 69 67  Resp: 16 16 16 16   Temp:      TempSrc:      SpO2: 100% 100% 100% 100%     Gen: NAD Cat 1 FHT  A/P: Pt is a G1P0 here for IOL.  IOL: SVE 3.5-4/80/-1, s/p AROM for clear fluid Cont pitocin  MWB: no sig med/surg issues besides h/o migraines  FWB: vertex   GBS neg   Lucillie Garfinkel MD

## 2015-10-19 NOTE — Anesthesia Procedure Notes (Signed)
Epidural Patient location during procedure: OB  Staffing Anesthesiologist: Franne Grip  Preanesthetic Checklist Completed: patient identified, site marked, surgical consent, pre-op evaluation, timeout performed, IV checked, risks and benefits discussed and monitors and equipment checked  Epidural Patient position: sitting Prep: DuraPrep Patient monitoring: blood pressure and heart rate Approach: midline Location: L3-L4 Injection technique: LOR saline  Needle:  Needle type: Tuohy  Needle gauge: 17 G Needle length: 9 cm Needle insertion depth: 5 cm Catheter type: closed end flexible Catheter size: 19 Gauge Catheter at skin depth: 13 cm Test dose: negative and Other  Assessment Events: blood not aspirated, injection not painful, no injection resistance, negative IV test and no paresthesia  Additional Notes Reason for block:procedure for pain

## 2015-10-19 NOTE — Anesthesia Pain Management Evaluation Note (Signed)
  CRNA Pain Management Visit Note  Patient: Kim Collins, 35 y.o., female  "Hello I am a member of the anesthesia team at [facility]. We have an anesthesia team available at all times to provide care throughout the hospital, including epidural management and anesthesia for C-section. I don't know your plan for the delivery whether it a natural birth, water birth, IV sedation, nitrous supplementation, doula or epidural, but we want to meet your pain goals."   1.Was your pain managed to your expectations on prior hospitalizations?     2.What is your expectation for pain management during this hospitalization?    3.How can we help you reach that goal?   Record the patient's initial score and the patient's pain goal.   Pain: 7  Pain Goal: 8 [facility] wants you to be able to say your pain was always managed very well.  Jabier Mutton 10/19/2015

## 2015-10-19 NOTE — Progress Notes (Signed)
S: no complaints.  O:  Vitals:   10/19/15 1031 10/19/15 1101 10/19/15 1131 10/19/15 1201  BP: 131/85 131/83 133/69 117/70  Pulse: 62 60 (!) 57 63  Resp: 16 18 16 16   Temp:      TempSrc:      SpO2:       Gen: NAD Cat 1 FHT  A/P: Pt is a G1P0here for IOL.  IOL: SVE 6.5/90/-1, active labor s/p AROM for clear fluid Cont pitocin  MWB: no sig med/surg issues besides h/o migraines  FWB: vertex   GBS neg   Lucillie Garfinkel MD

## 2015-10-20 LAB — CBC
HEMATOCRIT: 33.5 % — AB (ref 36.0–46.0)
HEMOGLOBIN: 11.5 g/dL — AB (ref 12.0–15.0)
MCH: 31.4 pg (ref 26.0–34.0)
MCHC: 34.3 g/dL (ref 30.0–36.0)
MCV: 91.5 fL (ref 78.0–100.0)
Platelets: 145 10*3/uL — ABNORMAL LOW (ref 150–400)
RBC: 3.66 MIL/uL — AB (ref 3.87–5.11)
RDW: 13.7 % (ref 11.5–15.5)
WBC: 16.8 10*3/uL — ABNORMAL HIGH (ref 4.0–10.5)

## 2015-10-20 NOTE — Lactation Note (Signed)
This note was copied from a baby's chart. Lactation Consultation Note  Patient Name: Kim Collins [id number] Date: 10/20/2015 Reason for consult: Initial assessment  Mom w/flat nipples and non-compressible breast tissue. Per parent report, infant had tried to latch previously on a number of occasions, but was unable to maintain latch sufficiently to nurse.    During consult, I observed infant unable to maintain latch to bare breasts bilaterally. NS (size 20) applied & infant improved ability to suckle continuously. Colostrum noted in nipple shield when infant would release latch.   [name] [facility] 10/20/2015, 12:03 AM

## 2015-10-20 NOTE — Progress Notes (Signed)
S: No complaints. Feeling well. Lochia appropriate. No subjective fevers/chills.   O:  Vitals:   10/19/15 1900 10/19/15 1955 10/20/15 0322 10/20/15 0647  BP: 135/81 125/78 125/76 124/89  Pulse: 88 92 89 74  Resp: 19 18 18 18   Temp: 98.4 F (36.9 C) 98.8 F (37.1 C) 98 F (36.7 C) 97.4 F (36.3 C)  TempSrc: Oral Oral Oral Oral  SpO2:        Gen: NAD, A&O Pulm: NWOB Abd: soft, appropriately ttp, fundus firm and below Umb GYN: vulva b/l symmetric, no swelling. L labial w/well healing incision - no s/s infection.  Ext: No evidence of DVT, trace edema b/l  Labs CBC    Component Value Date/Time   WBC 16.8 (H) 10/20/2015 0547   RBC 3.66 (L) 10/20/2015 0547   HGB 11.5 (L) 10/20/2015 0547   HGB 13.8 03/12/2015   HCT 33.5 (L) 10/20/2015 0547   HCT 42 03/12/2015   PLT 145 (L) 10/20/2015 0547   MCV 91.5 10/20/2015 0547   MCH 31.4 10/20/2015 0547   MCHC 34.3 10/20/2015 0547   RDW 13.7 10/20/2015 0547    A/P:  PPD1 s/p SVD and removal of L vulvar mole, doing well pp. AFVSS. Benign exam.  No sig med/surg issues except h/o migraines not requiring meds.   Continue present care. Plan for d/c either today or tomorrow depending on baby. Baby girl Kim Collins is doing well. BF.  Lucillie Garfinkel MD

## 2015-10-20 NOTE — Anesthesia Postprocedure Evaluation (Signed)
Anesthesia Post Note  Patient: Kim Collins  Procedure(s) Performed: * No procedures listed *  Patient location during evaluation: Mother Baby Anesthesia Type: Epidural Level of consciousness: awake and alert Pain management: pain level controlled Vital Signs Assessment: post-procedure vital signs reviewed and stable Respiratory status: spontaneous breathing, nonlabored ventilation and respiratory function stable Cardiovascular status: stable Postop Assessment: no headache, no backache, epidural receding and patient able to bend at knees Anesthetic complications: no     Last Vitals:  Vitals:   10/20/15 0322 10/20/15 0647  BP: 125/76 124/89  Pulse: 89 74  Resp: 18 18  Temp: 36.7 C 36.3 C    Last Pain:  Vitals:   10/20/15 0734  TempSrc:   PainSc: 1    Pain Goal: Patients Stated Pain Goal: 4 (10/19/15 0111)               Rayvon Char

## 2015-10-20 NOTE — Lactation Note (Signed)
This note was copied from a baby's chart. Lactation Consultation Note  Reviewed hand expression w/ mother.  Mother will need additional practice. Small drops expressed. Attempted latching in cross cradle without NS.  Baby came off and on. Mother has short shaft small nipples. Mother has been using #20NS.  Repositioned baby to football on L side with NS.  Demonstrated how to compress breast to keep baby active. Mother has been breastfeeding on 1 breast per feeding. Recommend breastfeeding on both breasts and changing positions. Assisted w/ latching in cross cradle (which mother prefers) on R side. Mother may need review w/ hand positioning. Baby latched for approx 20 min. LC will speak w/ RN to get mother pumping w/ DEBP since she is using NS. Recommend post pumping 4-5 times per day for 10-15 min. Pump set up in room.   Patient Name: Kim Collins [id number] Date: 10/20/2015 Reason for consult: Follow-up assessment   Maternal Data    Feeding Feeding Type: Breast Fed Length of feed: 20 min  LATCH Score/Interventions Latch: Grasps breast easily, tongue down, lips flanged, rhythmical sucking.  Audible Swallowing: A few with stimulation Intervention(s): Hand expression;Alternate breast massage  Type of Nipple: Everted at rest and after stimulation (short shaft) Intervention(s): Hand pump;Double electric pump  Comfort (Breast/Nipple): Soft / non-tender     Hold (Positioning): Assistance needed to correctly position infant at breast and maintain latch.  LATCH Score: 8  Lactation Tools Discussed/Used Tools: Nipple Shields Nipple shield size: 20   Consult Status Consult Status: Follow-up Date: 10/21/15 Follow-up type: In-patient    [name] [facility] 10/20/2015, 8:17 PM

## 2015-10-21 NOTE — Lactation Note (Signed)
This note was copied from a baby's chart. Lactation Consultation Note  Patient Name: Kim Collins [id number] Date: 10/21/2015 Reason for consult: Follow-up assessment   With this mom of a term baby, now 18 hours old. Mom has been breastfeeding with nipple shield, and baby is cluster feeding, . Mom was pumping when I walked in the room, and her milk is beginning to transition in. Mom added hand expression after pumping, and expressed a total of 4 ml's. Mom agreed to bottle feeding this to the baby. The baby finished the EBM, and was till showing hunger cues/fussy. Parents agreed to adding Alimentum as supplement, until mom's comes in more. Baby took 15 ml's formula, and tolerated well, and fell asleep on dad's chest.  The baby has an upper lip frenulum that extends to the gum line. She would not suckle on my finger, but her tongue seems to have limited mobility, especially with extension. Mom has flat nipples, which have become very red and sore. I decreased her to 21 flanges,and added coconut oil. Mom's areolas had been getting pulled with pumping. I also gave mom comfort gels, and instructed her in their use. Mom was also instructed in the use and purpose of the sore nipple shells, which she can alternate with comfort gels. I made mom an o/p lactation appointment for 9/6 at 1030 am.  I reviewed EBM and formula storage and preparation, and wrote out a feeding plan for mom. Mom was exhausted, and I gave her a lot of information. I also called Dr. Ouida Sills, pediatrician, to inform him of my findings.    Maternal Data    Feeding Feeding Type: Formula Nipple Type: Slow - flow  LATCH Score/Interventions       Type of Nipple: Flat  Comfort (Breast/Nipple): Engorged, cracked, bleeding, large blisters, severe discomfort Problem noted: Cracked, bleeding, blisters, bruises Intervention(s): Double electric pump  Problem noted: Severe discomfort (red, swollen nipples, very sore) Interventions  (Severe discomfort): Double electric pum;Flange size;Observe pumping (decreased to 21 with better fit)        Lactation Tools Discussed/Used Tools: Nipple [employer];Shells;Comfort gels (coconut oil with 21 flanges) Nipple shield size: 20;24   Consult Status Consult Status: Complete Date: 10/24/15 Follow-up type: Out-patient    Tonna Corner 10/21/2015, 11:50 AM

## 2015-10-21 NOTE — Discharge Summary (Signed)
Obstetric Discharge Summary Reason for Admission: induction of labor Prenatal Procedures: none Intrapartum Procedures: spontaneous vaginal delivery Postpartum Procedures: removal of L vulvar mole Complications-Operative and Postpartum: none Hemoglobin  Date Value Ref Range Status  10/20/2015 11.5 (L) 12.0 - 15.0 g/dL Final  03/12/2015 13.8 g/dL Final   HCT  Date Value Ref Range Status  10/20/2015 33.5 (L) 36.0 - 46.0 % Final  03/12/2015 42 % Final    Physical Exam:  General: alert and cooperative Lochia: appropriate Uterine Fundus: firm GYN: mole removal site c/d/i, no evidence of infection DVT Evaluation: No evidence of DVT seen on physical exam. Negative Homan's sign. No cords or calf tenderness. No significant calf/ankle edema.  Discharge Diagnoses: Term Pregnancy-delivered  Discharge Information: Date: 10/21/2015 Activity: pelvic rest Diet: routine Medications: Ibuprofen and Colace Condition: stable Instructions: refer to practice specific booklet Discharge to: home   Newborn Data: Live born female  Birth Weight: 6 lb 11.6 oz (3050 g) APGAR: 8, 9  Home with mother. To f/u in clinic in one week for postop evaluation of L vulvar mole removal site.   Tyson Dense 10/21/2015, 9:49 AM

## 2015-10-24 ENCOUNTER — Ambulatory Visit (HOSPITAL_COMMUNITY): Payer: 59

## 2015-10-25 NOTE — Progress Notes (Signed)
Uterine contractions

## 2017-02-25 ENCOUNTER — Other Ambulatory Visit: Payer: Self-pay | Admitting: Dermatology

## 2017-10-27 ENCOUNTER — Other Ambulatory Visit: Payer: Self-pay | Admitting: Obstetrics and Gynecology

## 2017-10-27 DIAGNOSIS — N644 Mastodynia: Secondary | ICD-10-CM

## 2017-10-30 ENCOUNTER — Other Ambulatory Visit: Payer: Self-pay | Admitting: Obstetrics and Gynecology

## 2017-10-30 ENCOUNTER — Ambulatory Visit
Admission: RE | Admit: 2017-10-30 | Discharge: 2017-10-30 | Disposition: A | Discharge status: Home / Self Care | Payer: 59 | Source: Ambulatory Visit | Attending: Obstetrics and Gynecology | Admitting: Obstetrics and Gynecology

## 2017-10-30 DIAGNOSIS — N644 Mastodynia: Secondary | ICD-10-CM

## 2017-12-23 ENCOUNTER — Other Ambulatory Visit: Payer: Self-pay | Admitting: Obstetrics and Gynecology

## 2017-12-23 DIAGNOSIS — Z803 Family history of malignant neoplasm of breast: Secondary | ICD-10-CM

## 2020-02-03 ENCOUNTER — Other Ambulatory Visit: Payer: Self-pay | Admitting: Obstetrics and Gynecology

## 2020-02-03 DIAGNOSIS — Z9189 Other specified personal risk factors, not elsewhere classified: Secondary | ICD-10-CM

## 2020-02-07 ENCOUNTER — Telehealth: Payer: Self-pay | Admitting: Dermatology

## 2020-02-07 NOTE — Telephone Encounter (Signed)
Patient called for a referral appointment from Arvella Nigh, MD.  Patient states she has seen Lavonna Monarch, MD before.  Patient is scheduled for 04/23/2020 at 3:30 with Lavonna Monarch, MD.

## 2020-03-02 ENCOUNTER — Other Ambulatory Visit: Payer: 59

## 2020-03-20 ENCOUNTER — Ambulatory Visit
Admission: RE | Admit: 2020-03-20 | Discharge: 2020-03-20 | Disposition: A | Discharge status: Home / Self Care | Payer: BC Managed Care – PPO | Source: Ambulatory Visit | Attending: Obstetrics and Gynecology | Admitting: Obstetrics and Gynecology

## 2020-03-20 DIAGNOSIS — Z9189 Other specified personal risk factors, not elsewhere classified: Secondary | ICD-10-CM

## 2020-03-20 MED ORDER — GADOBUTROL 1 MMOL/ML IV SOLN
7.0000 mL | Freq: Once | INTRAVENOUS | Status: AC | PRN
  Administered 2020-03-20: 7 mL via INTRAVENOUS

## 2020-04-23 ENCOUNTER — Ambulatory Visit: Payer: 59 | Admitting: Dermatology

## 2020-06-18 ENCOUNTER — Other Ambulatory Visit: Payer: Self-pay

## 2020-06-18 ENCOUNTER — Ambulatory Visit: Payer: BC Managed Care – PPO | Admitting: Dermatology

## 2020-06-18 DIAGNOSIS — Z1283 Encounter for screening for malignant neoplasm of skin: Secondary | ICD-10-CM

## 2020-06-30 ENCOUNTER — Encounter: Payer: Self-pay | Admitting: Dermatology

## 2020-06-30 NOTE — Progress Notes (Signed)
   New Patient   Subjective  Kim Collins is a 40 y.o. female who presents for the following: Annual Exam (Skin exam Patient GYN doc sent her here. Concerns patient might have psoriasis. She is not sure but she has no spots today. ).  General skin examination, question about possible psoriasis Location:  Duration:  Quality:  Associated Signs/Symptoms: Modifying Factors:  Severity:  Timing: Context:    The following portions of the chart were reviewed this encounter and updated as appropriate:  Tobacco  Allergies  Meds  Problems  Med Hx  Surg Hx  Fam Hx      Objective  Well appearing patient in no apparent distress; mood and affect are within normal limits. Objective  Mid Back: Full body skin exam.No atypical moles or non mole skin cancers.  Current signs of psoriasis skin or nails.    A full examination was performed including scalp, head, eyes, ears, nose, lips, neck, chest, axillae, abdomen, back, buttocks, bilateral upper extremities, bilateral lower extremities, hands, feet, fingers, toes, fingernails, and toenails. All findings within normal limits unless otherwise noted below.  Areas beneath undergarments not fully examined.   Assessment & Plan  Screening for malignant neoplasm of skin Mid Back  Yearly skin exam.  Encouraged to self examine twice annually.  Continued ultraviolet protection.

## 2022-04-12 IMAGING — MR MR BREAST BILAT WO/W CM
8 of 12 series · 33 of 48 positions shown · IV contrast (gadavist)
Comparison: Previous exam(s).

CLINICAL DATA: 39-year-old female with strong family history of
breast cancer.

LABS:  None performed site.
EXAM:
BILATERAL BREAST MRI WITH AND WITHOUT CONTRAST
TECHNIQUE: Multiplanar, multisequence MR images of both breasts were obtained
prior to and following the intravenous administration of 7 ml of
Gadavist.

[Series 2: t2_tirm_tra ipat (a-p) · axial · 3.0mm · 0.64mm/px · 1 of 55 slices shown]
[im 1/55]
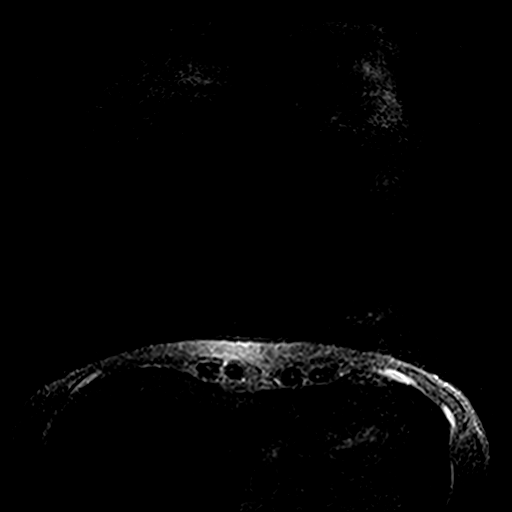

[Series 3: fl3d pre-cm no · axial · non-contrast · 1.2mm · 0.86mm/px · z∈[-105,+66]mm · 5 of 144 slices shown]
[im 1/144]
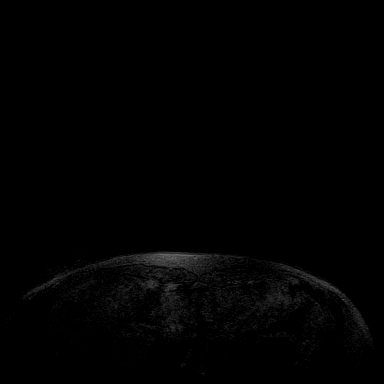
[im 36/144]
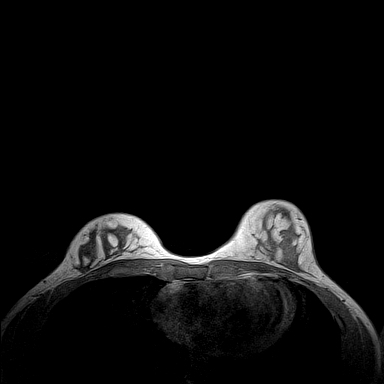
[im 72/144]
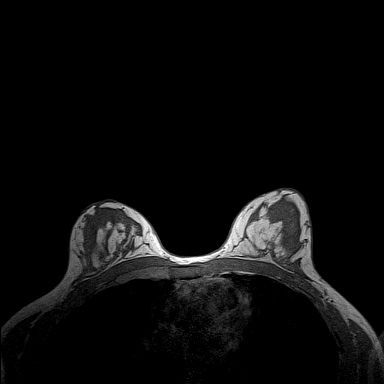
[im 108/144]
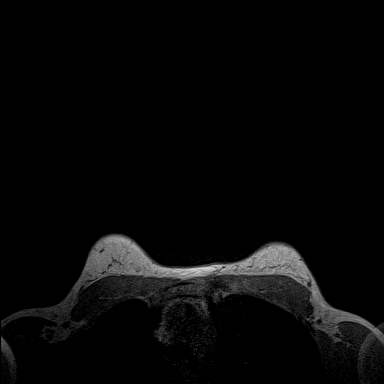
[im 144/144]
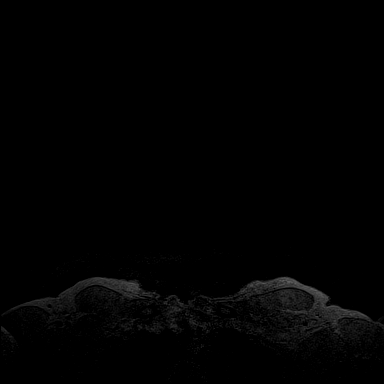

[Series 4: fl3d pre-cm · axial · non-contrast · 1.2mm · 0.86mm/px · z∈[-105,+66]mm · 5 of 144 slices shown]
[im 1/144]
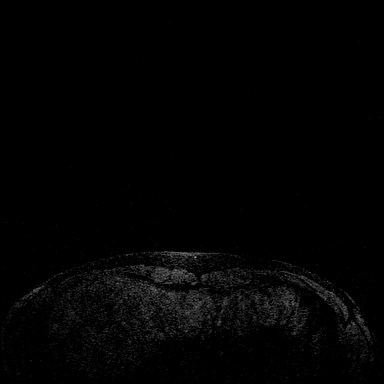
[im 36/144]
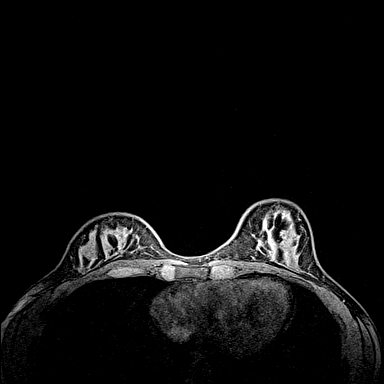
[im 72/144]
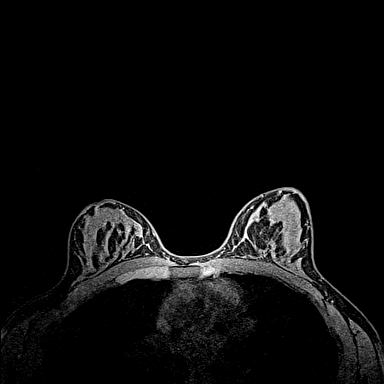
[im 108/144]
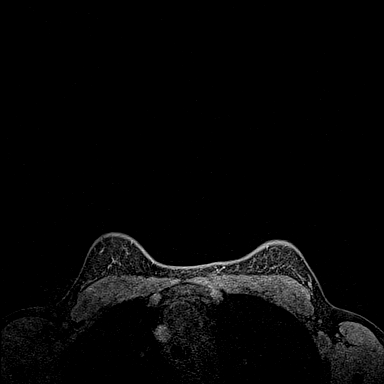
[im 144/144]
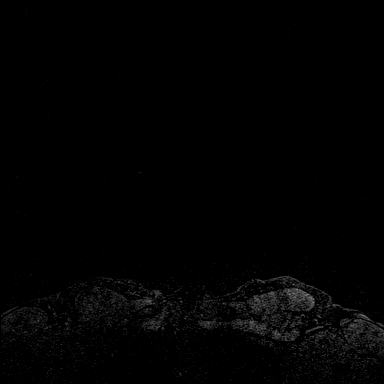

[Series 5: fl3d post-cm 20 · axial · 1.2mm · 0.86mm/px · z∈[-105,+66]mm · 5 of 144 slices shown (1 of 3)]
[im 1/144]
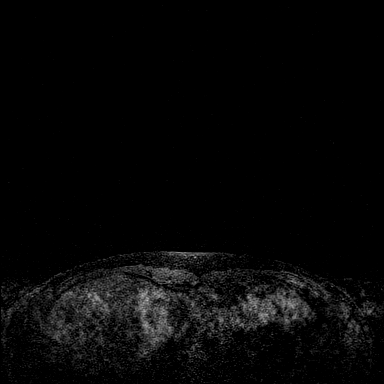
[im 36/144]
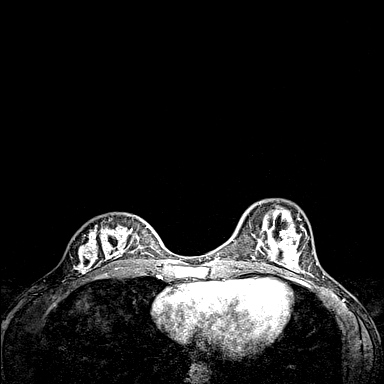
[im 72/144]
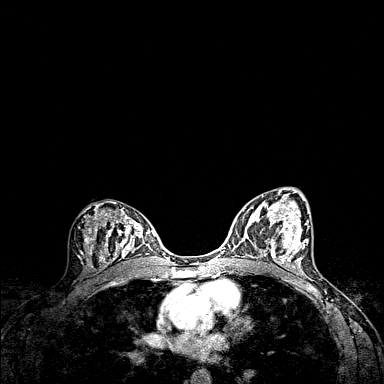
[im 108/144]
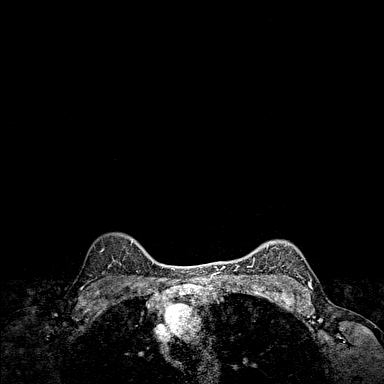
[im 144/144]
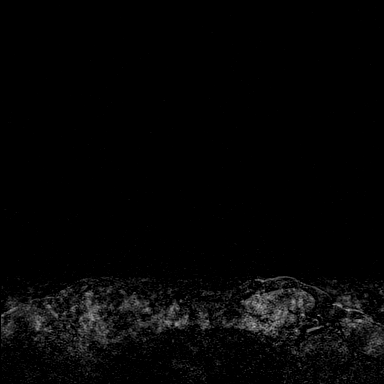

[Series 6: fl3d post-cm 20 · axial · 1.2mm · 0.86mm/px · z∈[-105,+66]mm · 5 of 144 slices shown (2 of 3)]
[im 1/144]
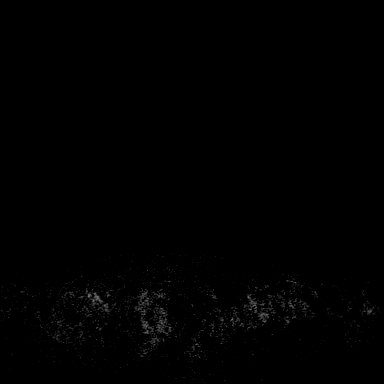
[im 36/144]
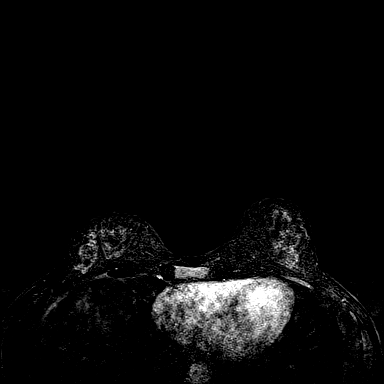
[im 72/144]
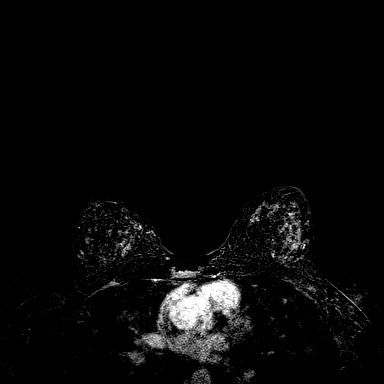
[im 108/144]
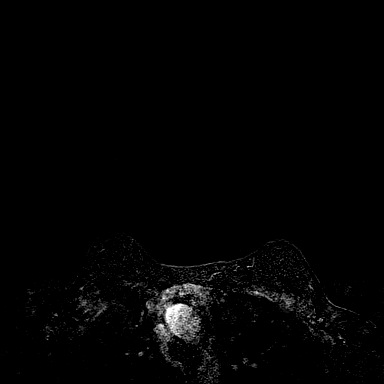
[im 144/144]
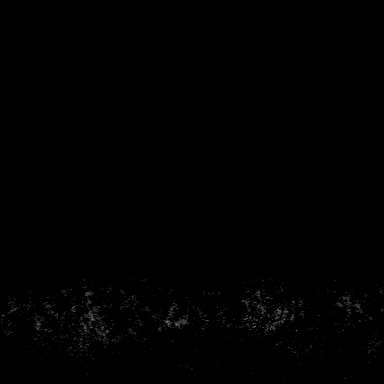

[Series 7: fl3d post-cm 20 · axial · 172.8mm · 0.86mm/px · 1 of 1 slices shown (3 of 3)]
[im 1/1]
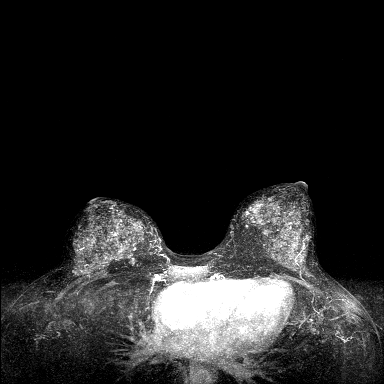

[Series 8: fl3d post-cm 3min · axial · 1.2mm · 0.86mm/px · z∈[-105,+66]mm · 6 of 144 slices shown]
[im 1/144]
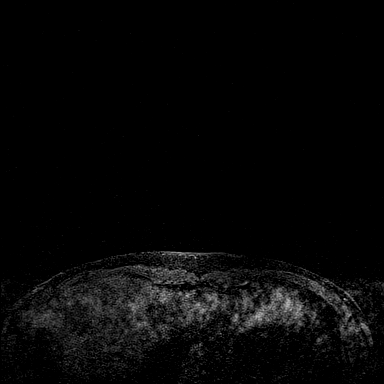
[im 29/144]
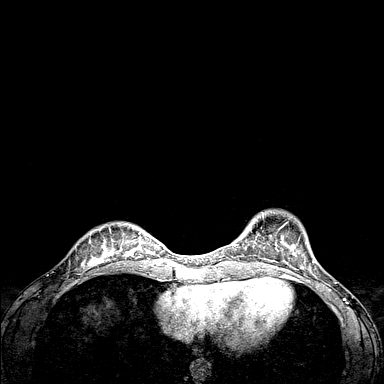
[im 58/144]
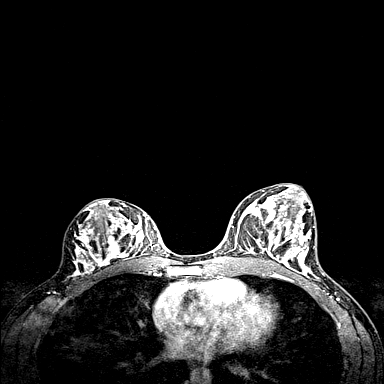
[im 86/144]
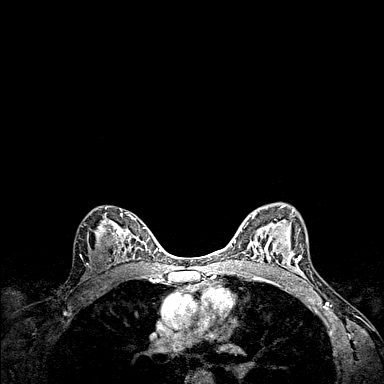
[im 115/144]
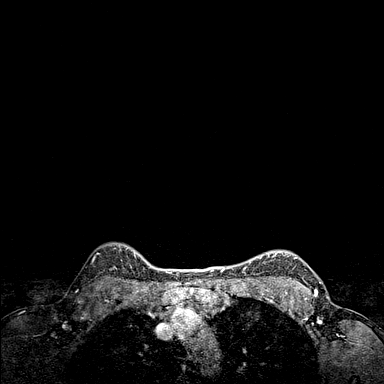
[im 144/144]
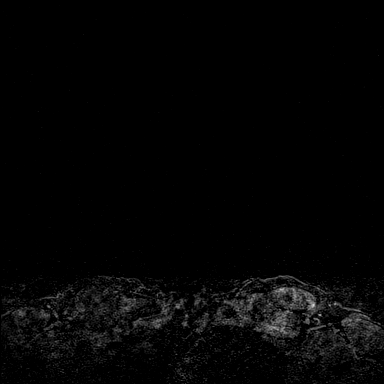

[Series 9: fl3d post-cm 3min_sub · axial · 1.2mm · 0.86mm/px · z∈[-105,+31]mm · 5 of 144 slices shown]
[im 1/144]
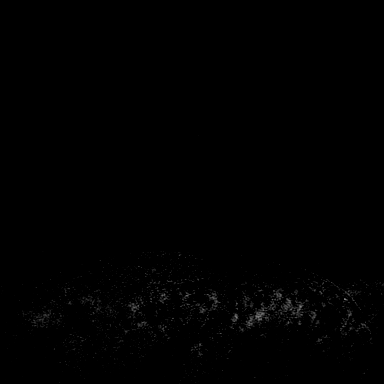
[im 29/144]
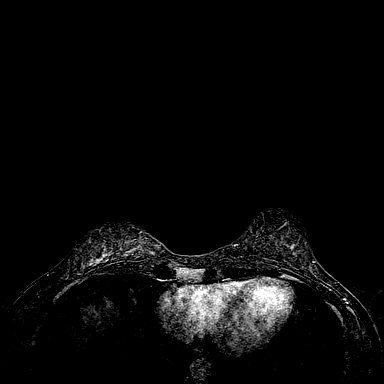
[im 58/144]
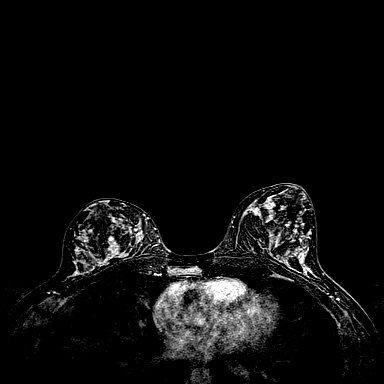
[im 86/144]
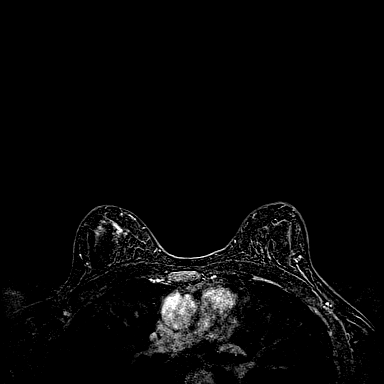
[im 115/144]
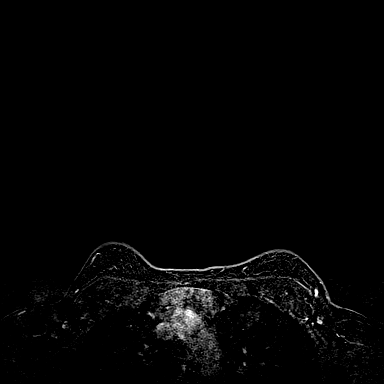

[33 of 48 positions shown; findings below may reference images not displayed]

Three-dimensional MR images were rendered by post-processing of the
original MR data on an independent workstation. The
three-dimensional MR images were interpreted, and findings are
reported in the following complete MRI report for this study. Three
dimensional images were evaluated at the independent interpreting
workstation using the DynaCAD thin client.
FINDINGS: Breast composition: d. Extreme fibroglandular tissue.

Background parenchymal enhancement: Marked.

Right breast: No suspicious mass or abnormal enhancement beyond
background.

Left breast: No suspicious mass or abnormal enhancement beyond
background.

Lymph nodes: No abnormal appearing lymph nodes.

Ancillary findings:  None.
IMPRESSION: No MRI evidence of malignancy in either breast. Please note
evaluation is somewhat limited in the setting of marked background
parenchymal enhancement.

RECOMMENDATION:
Routine annual screening based on the patient's personal risk
factors.

BI-RADS CATEGORY  1: Negative.

## 2023-02-27 ENCOUNTER — Other Ambulatory Visit: Payer: Self-pay | Admitting: Obstetrics and Gynecology

## 2023-02-27 DIAGNOSIS — Z803 Family history of malignant neoplasm of breast: Secondary | ICD-10-CM

## 2023-04-13 ENCOUNTER — Other Ambulatory Visit: Payer: BC Managed Care – PPO

## 2023-10-25 ENCOUNTER — Other Ambulatory Visit: Payer: Self-pay

## 2023-10-25 ENCOUNTER — Ambulatory Visit
Admission: EM | Admit: 2023-10-25 | Discharge: 2023-10-25 | Disposition: A | Discharge status: Home / Self Care | Attending: Physician Assistant | Admitting: Physician Assistant

## 2023-10-25 DIAGNOSIS — H18891 Other specified disorders of cornea, right eye: Secondary | ICD-10-CM

## 2023-10-25 MED ORDER — CIPROFLOXACIN HCL 0.3 % OP SOLN: 1.0000 [drp] | OPHTHALMIC | 0 refills | Status: AC

## 2023-10-25 NOTE — ED Triage Notes (Addendum)
 Pt presents with a chief complaint of right eye problem. Noticed symptoms two days ago on Friday 9/5. Felt like something was in the eye Friday night. Right eye has been red and watery since.   Currently rates overall right eye pain a 2/10. Saline eye drops applied two times, no improvement. Usually wears contacts. Wearing glasses in triage. Denies blurred vision.

## 2023-10-25 NOTE — ED Provider Notes (Signed)
 [facility]    CSN: [id number] Arrival date & time: 10/25/23  0955      History   Chief Complaint Chief Complaint  Patient presents with   Eye Problem    HPI Kim Collins is a 43 y.o. female.   HPI  Discussed the use of AI scribe software for clinical note transcription with the patient, who gave verbal consent to proceed.  The patient presents with right eye irritation and redness.  She woke up in the middle of the night on Friday with the sensation of a foreign body in her eye, possibly an eyelash or other irritant. Despite attempts to alleviate the discomfort, the irritation persisted.  By the following morning, the eye remained irritated and red. She decided to wait a day to see if the condition improved, but upon waking this morning, the eye was still red and felt irritated. The eye is watery but not producing exudate or crustiness, which she associates with previous episodes of conjunctivitis.  No vision changes such as blurriness or diplopia. She has been wearing glasses instead of contacts since the onset of symptoms. No photophobia and no pain with eye movement, although the eye feels 'clearly not right'.  She mentions a past slight allergy to preservatives in certain eye drops, which has previously affected her vision. She uses preservative-free saline solutions as recommended by her eye doctor.    Past Medical History:  Diagnosis Date   Migraines    Seasonal allergies     Patient Active Problem List   Diagnosis Date Noted   Pregnancy 10/19/2015    Past Surgical History:  Procedure Laterality Date   WISDOM TOOTH EXTRACTION      OB History     Gravida  1   Para  1   Term  1   Preterm      AB      Living  1      SAB      IAB      Ectopic      Multiple  0   Live Births  1            Home Medications    Prior to Admission medications   Medication Sig Start Date End Date Taking? Authorizing Provider   ciprofloxacin  (CILOXAN ) 0.3 % ophthalmic solution Place 1 drop into the right eye every 2 (two) hours. Administer 1 drop, every 2 hours, while awake, for 2 days. Then 1 drop, every 4 hours, while awake, for the next 5 days. 10/25/23  Yes Shane Badeaux E, PA-C  CRYSELLE-28 0.3-30 MG-MCG tablet Take 1 tablet by mouth daily. 05/24/20   [provider]    Family History Family History  Problem Relation Age of Onset   Cancer Father    Diabetes Father    Hypertension Father    Cancer Paternal Grandmother    Cancer Paternal Aunt     Social History Social History   Tobacco Use   Smoking status: Never   Smokeless tobacco: Never  Vaping Use   Vaping status: Never Used  Substance Use Topics   Alcohol use: Yes   Drug use: No     Allergies   Egg-derived products   Review of Systems Review of Systems  Eyes:  Positive for pain, discharge (watery drainage) and redness. Negative for photophobia and visual disturbance.     Physical Exam Triage Vital Signs ED Triage Vitals  Encounter Vitals Group     BP 10/25/23 1003  121/61     Girls Systolic BP Percentile --      Girls Diastolic BP Percentile --      Boys Systolic BP Percentile --      Boys Diastolic BP Percentile --      Pulse Rate 10/25/23 1003 75     Resp 10/25/23 1003 16     Temp 10/25/23 1003 98.3 F (36.8 C)     Temp Source 10/25/23 1003 Oral     SpO2 10/25/23 1003 98 %     Weight 10/25/23 1003 125 lb (56.7 kg)     Height 10/25/23 1003 5' 2 (1.575 m)     Head Circumference --      Peak Flow --      Pain Score 10/25/23 1014 2     Pain Loc --      Pain Education --      Exclude from Growth Chart --    No data found.  Updated Vital Signs BP 121/61 (BP Location: Right Arm)   Pulse 75   Temp 98.3 F (36.8 C) (Oral)   Resp 16   Ht 5' 2 (1.575 m)   Wt 125 lb (56.7 kg)   LMP  (Within Months)   SpO2 98%   Breastfeeding No   BMI 22.86 kg/m   Visual Acuity Right Eye Distance:   Left Eye Distance:    Bilateral Distance:    Right Eye Near:   Left Eye Near:    Bilateral Near:     Physical Exam Vitals reviewed.  Constitutional:      General: She is awake.     Appearance: Normal appearance. She is well-developed and well-groomed.  HENT:     Head: Normocephalic and atraumatic.  Eyes:     General: Lids are normal. Gaze aligned appropriately.        Right eye: No foreign body, discharge or hordeolum.        Left eye: No foreign body, discharge or hordeolum.     Extraocular Movements: Extraocular movements intact.     Right eye: Normal extraocular motion and no nystagmus.     Left eye: Normal extraocular motion and no nystagmus.     Conjunctiva/sclera:     Right eye: Right conjunctiva is injected. No chemosis, exudate or hemorrhage.    Left eye: Left conjunctiva is not injected. No chemosis, exudate or hemorrhage.    Pupils: Pupils are equal, round, and reactive to light.     Right eye: No corneal abrasion or fluorescein uptake.     Comments: Right eye: Pt has evidence of diffuse corneal irritation but no discrete fluorescein uptake consistent with an abrasion   Pulmonary:     Effort: Pulmonary effort is normal.  Neurological:     Mental Status: She is alert and oriented to person, place, and time.  Psychiatric:        Attention and Perception: Attention and perception normal.        Mood and Affect: Mood and affect normal.        Speech: Speech normal.        Behavior: Behavior normal. Behavior is cooperative.      UC Treatments / Results  Labs (all labs ordered are listed, but only abnormal results are displayed) Labs Reviewed - No data to display  EKG   Radiology No results found.  Procedures Procedures (including critical care time)  Medications Ordered in UC Medications - No data to display  Initial Impression / Assessment and  Plan / UC Course  I have reviewed the triage vital signs and the nursing notes.  Pertinent labs & imaging results that were  available during my care of the patient were reviewed by me and considered in my medical decision making (see chart for details).      Final Clinical Impressions(s) / UC Diagnoses   Final diagnoses:  Corneal irritation of right eye  Irritation of right eye Reports waking with irritation in the right eye, initially feeling like a foreign body sensation. The eye is red and watery without exudate. No pain, vision changes, or light sensitivity. Fluorescein staining shows diffuse irritation with no definitive corneal abrasion, but concern for generalized irritation and potential tiny scratches. Differential includes infection or mechanical irritation. Due to contact lens use, higher risk of bacterial infection, warranting antibiotic treatment. - Prescribe Cipro  eye drops: one drop every two hours while awake for the first two days, then every four hours while awake for the next five days. - Advise against wearing contact lenses until the course of treatment is completed and start with a new pair afterward. - Recommend follow-up with an ophthalmologist if symptoms persist after completing the drops or if symptoms worsen. - Advise using lubricating eye drops or eye washes for comfort. - Suggest cool compresses for additional comfort.   Discharge Instructions      VISIT SUMMARY:  You came in today because of irritation and redness in your right eye that started suddenly in the middle of the night. You described it as feeling like something was in your eye, and despite your efforts, the irritation persisted. Your eye is watery but not producing any discharge, and you have no vision changes or pain with eye movement. 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  YOUR PLAN:  -IRRITATION OF RIGHT EYE: You have irritation and redness in your right eye, which may be due to an infection or mechanical irritation. To treat this, you  will use Ciprofloxacin  eye drops: one drop every two hours while awake for the first two days, then every four hours while awake for the next five days. Avoid wearing contact lenses until you finish the treatment and start with a new pair afterward. Use lubricating eye drops or eye washes and cool compresses for comfort. Follow up with an ophthalmologist if symptoms persist after completing the drops or if they worsen.  -ALLERGY TO EYE DROP PRESERVATIVES: You have a known allergy to preservatives in certain eye drops, which has previously caused vision issues. Continue using preservative-free solutions for any eye treatments.  INSTRUCTIONS:  Follow up with an ophthalmologist if your symptoms persist after completing the eye drops or if they worsen     ED Prescriptions     Medication Sig Dispense Auth. Provider   ciprofloxacin  (CILOXAN ) 0.3 % ophthalmic solution Place 1 drop into the right eye every 2 (two) hours. Administer 1 drop, every 2 hours, while awake, for 2 days. Then 1 drop, every 4 hours, while awake, for the next 5 days. 5 mL Jadene Stemmer E, PA-C      PDMP not reviewed this encounter.   Payton Moder, Rocky BRAVO, PA-C 10/25/23 1045

## 2023-10-25 NOTE — Discharge Instructions (Signed)
 VISIT SUMMARY:  You came in today because of irritation and redness in your right eye that started suddenly in the middle of the night. You described it as feeling like something was in your eye, and despite your efforts, the irritation persisted. Your eye is watery but not producing any discharge, and you have no vision changes or pain with eye movement. 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  YOUR PLAN:  -IRRITATION OF RIGHT EYE: You have irritation and redness in your right eye, which may be due to an infection or mechanical irritation. To treat this, you will use Ciprofloxacin  eye drops: one drop every two hours while awake for the first two days, then every four hours while awake for the next five days. Avoid wearing contact lenses until you finish the treatment and start with a new pair afterward. Use lubricating eye drops or eye washes and cool compresses for comfort. Follow up with an ophthalmologist if symptoms persist after completing the drops or if they worsen.  -ALLERGY TO EYE DROP PRESERVATIVES: You have a known allergy to preservatives in certain eye drops, which has previously caused vision issues. Continue using preservative-free solutions for any eye treatments.  INSTRUCTIONS:  Follow up with an ophthalmologist if your symptoms persist after completing the eye drops or if they worsen

## 2024-02-13 ENCOUNTER — Ambulatory Visit
